# Patient Record
Sex: Female | Born: 1995 | Race: Black or African American | Hispanic: No | Marital: Single | Smoking: Former smoker
Health system: Southern US, Community
[De-identification: ages and names within clinical notes are randomized; demographics above are authoritative.]

---

## 2013-05-17 ENCOUNTER — Encounter (HOSPITAL_BASED_OUTPATIENT_CLINIC_OR_DEPARTMENT_OTHER): Payer: Self-pay | Admitting: Emergency Medicine

## 2013-05-17 ENCOUNTER — Emergency Department (HOSPITAL_BASED_OUTPATIENT_CLINIC_OR_DEPARTMENT_OTHER)
Admission: EM | Admit: 2013-05-17 | Discharge: 2013-05-17 | Disposition: A | Discharge status: Home / Self Care | Payer: BC Managed Care – PPO | Attending: Emergency Medicine | Admitting: Emergency Medicine

## 2013-05-17 ENCOUNTER — Emergency Department (HOSPITAL_BASED_OUTPATIENT_CLINIC_OR_DEPARTMENT_OTHER): Payer: BC Managed Care – PPO

## 2013-05-17 DIAGNOSIS — J069 Acute upper respiratory infection, unspecified: Secondary | ICD-10-CM | POA: Insufficient documentation

## 2013-05-17 DIAGNOSIS — J3489 Other specified disorders of nose and nasal sinuses: Secondary | ICD-10-CM | POA: Insufficient documentation

## 2013-05-17 DIAGNOSIS — R1110 Vomiting, unspecified: Secondary | ICD-10-CM | POA: Insufficient documentation

## 2013-05-17 DIAGNOSIS — Z87891 Personal history of nicotine dependence: Secondary | ICD-10-CM | POA: Insufficient documentation

## 2013-05-17 DIAGNOSIS — J029 Acute pharyngitis, unspecified: Secondary | ICD-10-CM | POA: Insufficient documentation

## 2013-05-17 HISTORY — DX: Morbid (severe) obesity due to excess calories: E66.01

## 2013-05-17 MED ORDER — BENZONATATE 100 MG PO CAPS: 100.0000 mg | ORAL_CAPSULE | Freq: Three times a day (TID) | ORAL | Status: DC

## 2013-05-17 NOTE — ED Notes (Signed)
Pt c/o productive cough x 2 months. 

## 2013-05-17 NOTE — ED Provider Notes (Signed)
History    CSN: [id number] Arrival date & time 05/17/13  1901  First MD Initiated Contact with Patient 05/17/13 2013     Chief Complaint  Patient presents with  . 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 States she developed cough, sore throat and congestion for the past few days. No fever or chills. No trouble swallowing or breathing. No lower ext swelling or pain. Pt admits to post-tussive emesis. No abd pain, diarrhea.   Past Medical History  Diagnosis Date  . Morbid obesity    History reviewed. No pertinent past surgical history. No family history on file. History  Substance Use Topics  . Smoking status: Former Games developer  . Smokeless tobacco: Not on file  . Alcohol Use: No   OB History   Grav Para Term Preterm Abortions TAB SAB Ect Mult Living                 Review of Systems  Constitutional: Negative for fever and chills.  HENT: Positive for congestion, sore throat and rhinorrhea. Negative for neck pain and neck stiffness.   Respiratory: Positive for cough. Negative for shortness of breath and wheezing.   Cardiovascular: Negative for chest pain and palpitations.  Gastrointestinal: Positive for vomiting. Negative for nausea, abdominal pain and diarrhea.  Musculoskeletal: Negative for myalgias and back pain.  Skin: Negative for rash and wound.  Neurological: Negative for dizziness, light-headedness and headaches.  All other systems reviewed and are negative.    Allergies  Review of patient's allergies indicates no known allergies.  Home Medications   Current Outpatient Rx  Name  Route  Sig  Dispense  Refill  . benzonatate (TESSALON) 100 MG capsule   Oral   Take 1 capsule (100 mg total) by mouth every 8 (eight) hours.   21 capsule   0    BP 154/95  Pulse 104  Temp(Src) 98.4 F (36.9 C) (Oral)  Resp  20  Ht 5\' 9"  (1.753 m)  Wt 350 lb (158.759 kg)  BMI 51.66 kg/m2  SpO2 100%  LMP 04/17/2013 Physical Exam  Nursing note and vitals reviewed. Constitutional: She is oriented to person, place, and time. She appears well-developed and well-nourished. No distress.  HENT:  Head: Normocephalic and atraumatic.  Mouth/Throat: Oropharynx is clear and moist. No oropharyngeal exudate.  Eyes: EOM are normal. Pupils are equal, round, and reactive to light.  Neck: Normal range of motion. Neck supple.  Cardiovascular: Normal rate and regular rhythm.   Pulmonary/Chest: Effort normal and breath sounds normal. No respiratory distress. She has no wheezes. She has no rales. She exhibits no tenderness.  Abdominal: Soft. Bowel sounds are normal. She exhibits no distension and no mass. There is no tenderness. There is no rebound and no guarding.  Musculoskeletal: Normal range of motion. She exhibits no edema and no tenderness.  No calf swelling or tenderness   Lymphadenopathy:    She has no cervical adenopathy.  Neurological: She is alert and oriented to person, place, and time.  Skin: Skin is warm and dry. No rash noted. No erythema.  Psychiatric: She has a normal mood and affect. Her behavior is normal.    ED Course  Procedures (including critical care time) Labs Reviewed - No data to display Dg Chest 2 View  05/17/2013   *RADIOLOGY REPORT*  Clinical Data: 17 year old female cough.  Smoker.  CHEST - 2 VIEW  Comparison: None.  Findings: Low lung volumes particularly on the lateral view. Cardiac size and mediastinal contours are within normal limits. Visualized tracheal air column is within normal limits.  No pneumothorax pulmonary edema pleural effusion or confluent opacity. Mild crowding lung markings. No acute osseous abnormality identified.  IMPRESSION: Low lung volumes, otherwise no acute cardiopulmonary abnormality.   Original Report Authenticated By: Erskine Speed, M.D.   1. URI, acute     MDM   Likely recurrent viral illness from day care. Pt given generalized precautions and will symptomatically treat cough.   Loren Racer, MD 05/17/13 2050

## 2013-05-17 NOTE — ED Notes (Signed)
MD at bedside. 

## 2014-02-18 ENCOUNTER — Emergency Department (HOSPITAL_BASED_OUTPATIENT_CLINIC_OR_DEPARTMENT_OTHER): Payer: No Typology Code available for payment source

## 2014-02-18 ENCOUNTER — Encounter (HOSPITAL_BASED_OUTPATIENT_CLINIC_OR_DEPARTMENT_OTHER): Payer: Self-pay | Admitting: Emergency Medicine

## 2014-02-18 ENCOUNTER — Emergency Department (HOSPITAL_BASED_OUTPATIENT_CLINIC_OR_DEPARTMENT_OTHER)
Admission: EM | Admit: 2014-02-18 | Discharge: 2014-02-18 | Disposition: A | Discharge status: Home / Self Care | Payer: No Typology Code available for payment source | Attending: Emergency Medicine | Admitting: Emergency Medicine

## 2014-02-18 DIAGNOSIS — S60222A Contusion of left hand, initial encounter: Secondary | ICD-10-CM

## 2014-02-18 DIAGNOSIS — Z87891 Personal history of nicotine dependence: Secondary | ICD-10-CM | POA: Insufficient documentation

## 2014-02-18 DIAGNOSIS — IMO0002 Reserved for concepts with insufficient information to code with codable children: Secondary | ICD-10-CM | POA: Insufficient documentation

## 2014-02-18 DIAGNOSIS — S60229A Contusion of unspecified hand, initial encounter: Secondary | ICD-10-CM | POA: Insufficient documentation

## 2014-02-18 DIAGNOSIS — Y92410 Unspecified street and highway as the place of occurrence of the external cause: Secondary | ICD-10-CM | POA: Insufficient documentation

## 2014-02-18 DIAGNOSIS — S43402A Unspecified sprain of left shoulder joint, initial encounter: Secondary | ICD-10-CM

## 2014-02-18 DIAGNOSIS — Y9389 Activity, other specified: Secondary | ICD-10-CM | POA: Insufficient documentation

## 2014-02-18 MED ORDER — IBUPROFEN 800 MG PO TABS: 800.0000 mg | ORAL_TABLET | Freq: Three times a day (TID) | ORAL | Status: AC

## 2014-02-18 MED ORDER — IBUPROFEN 800 MG PO TABS
800.0000 mg | ORAL_TABLET | Freq: Once | ORAL | Status: AC
  Administered 2014-02-18: 800 mg via ORAL
  Filled 2014-02-18: qty 1
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
## 2014-02-21 ENCOUNTER — Emergency Department (HOSPITAL_BASED_OUTPATIENT_CLINIC_OR_DEPARTMENT_OTHER)
Admission: EM | Admit: 2014-02-21 | Discharge: 2014-02-21 | Disposition: A | Discharge status: Home / Self Care | Payer: No Typology Code available for payment source | Attending: Emergency Medicine | Admitting: Emergency Medicine

## 2014-02-21 DIAGNOSIS — Z87891 Personal history of nicotine dependence: Secondary | ICD-10-CM | POA: Insufficient documentation

## 2014-02-21 DIAGNOSIS — Y92410 Unspecified street and highway as the place of occurrence of the external cause: Secondary | ICD-10-CM | POA: Insufficient documentation

## 2014-02-21 DIAGNOSIS — Y9389 Activity, other specified: Secondary | ICD-10-CM | POA: Insufficient documentation

## 2014-02-21 DIAGNOSIS — S199XXA Unspecified injury of neck, initial encounter: Principal | ICD-10-CM

## 2014-02-21 DIAGNOSIS — S0993XA Unspecified injury of face, initial encounter: Secondary | ICD-10-CM | POA: Insufficient documentation

## 2014-02-21 DIAGNOSIS — R220 Localized swelling, mass and lump, head: Secondary | ICD-10-CM

## 2014-02-21 MED ORDER — MAGIC MOUTHWASH: 10.0000 mL | Freq: Four times a day (QID) | ORAL | Status: AC

## 2014-02-21 NOTE — ED Provider Notes (Signed)
Medical screening examination/treatment/procedure(s) were performed by non-physician practitioner and as supervising physician I was immediately available for consultation/collaboration.   EKG Interpretation None        Allysia Ingles, MD 02/21/14 2352 
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
## 2014-02-21 NOTE — Discharge Instructions (Signed)
Stomatitis Stomatitis is an inflammation of the mucous lining of the mouth. It can affect part of the mouth or the whole mouth. The intensity of symptoms can range from mild to severe. It can affect your cheek, teeth, gums, lips, or tongue. In almost all cases, the lining of the mouth becomes swollen, red, and painful. Painful ulcers can develop in your mouth. Stomatitis recurs in some people. CAUSES  There are many common causes of stomatitis. They include:  Viruses (such as cold sores or shingles).  Canker sores.  Bacteria (such as ulcerative gingivitis or sexually transmitted diseases).  Fungus or yeast (such as candidiasis or oral thrush).  Poor oral hygiene and poor nutrition (Vincent's stomatitis or trench mouth).  Lack of vitamin B, vitamin C, or niacin.  Dentures or braces that do not fit properly.  High acid foods (uncommon).  Sharp or broken teeth.  Cheek biting.  Breathing through the mouth.  Chewing tobacco.  Allergy to toothpaste, mouthwash, candy, gum, lipstick, or some medicines.  Burning your mouth with hot drinks or food.  Exposure to dyes, heavy metals, acid fumes, or mineral dust. SYMPTOMS   Painful ulcers in the mouth.  Blisters in the mouth.  Bleeding gums.  Swollen gums.  Irritability.  Bad breath.  Bad taste in the mouth.  Fever.  Trouble eating because of burning and pain in the mouth. DIAGNOSIS  Your caregiver will examine your mouth and look for bleeding gums and mouth ulcers. Your caregiver may ask you about the medicines you are taking. Your caregiver may suggest a blood test and tissue sample (biopsy) of the mouth ulcer or mass if either is present. This will help find the cause of your condition. TREATMENT  Your treatment will depend on the cause of your condition. Your caregiver will first try to treat your symptoms.   You may be given pain medicine. Topical anesthetic may be used to numb the area if you have severe  pain.  Your caregiver may prescribe antibiotic medicine if you have a bacterial infection.  Your caregiver may prescribe antifungal medicine if you have a fungal infection.  You may need to take antiviral medicine if you have a viral infection like herpes.  You may be asked to use medicated mouth rinses.  Your caregiver will advise you about proper brushing and using a soft toothbrush. You also need to get your teeth cleaned regularly. HOME CARE INSTRUCTIONS   Maintain good oral hygiene. This is especially important for transplant patients.  Brush your teeth carefully with a soft, nylon-bristled toothbrush.  Floss at least 2 times a day.  Clean your mouth after eating.  Rinse your mouth with salt water 3 to 4 times a day.  Gargle with cold water.  Use topical numbing medicines to decrease pain if recommended by your caregiver.  Stop smoking, and stop using chewing or smokeless tobacco.  Avoid eating hot and spicy foods.  Eat soft and bland food.  Reduce your stress wherever possible.  Eat healthy and nutritious foods. SEEK MEDICAL CARE IF:   Your symptoms persist or get worse.  You develop new symptoms.  Your mouth ulcers are present for more than 3 weeks.  Your mouth ulcers come back frequently.  You have increasing difficulty with normal eating and drinking.  You have increasing fatigue or weakness.  You develop loss of appetite or nausea. SEEK IMMEDIATE MEDICAL CARE IF:   You have a fever.  You develop pain, redness, or sores around one or both   eyes.  You cannot eat or drink because of pain or other symptoms.  You develop worsening weakness, or you faint.  You develop vomiting or diarrhea.  You develop chest pain, shortness of breath, or rapid and irregular heartbeats. MAKE SURE YOU:  Understand these instructions.  Will watch your condition.  Will get help right away if you are not doing well or get worse. Document Released: 09/06/2007  Document Revised: 02/01/2012 Document Reviewed: 06/18/2011 [employer] Patient Information 2014 [employer].  
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
